# Patient Record
Sex: Female | Born: 1975 | Hispanic: No | Marital: Married | Smoking: Former smoker
Health system: Southern US, Community
[De-identification: ages and names within clinical notes are randomized; demographics above are authoritative.]

## PROBLEM LIST (undated history)

## (undated) DIAGNOSIS — D649 Anemia, unspecified: Secondary | ICD-10-CM

## (undated) DIAGNOSIS — J45909 Unspecified asthma, uncomplicated: Secondary | ICD-10-CM

## (undated) HISTORY — DX: Unspecified asthma, uncomplicated: J45.909

## (undated) HISTORY — DX: Anemia, unspecified: D64.9

---

## 1999-02-19 ENCOUNTER — Other Ambulatory Visit: Admission: RE | Admit: 1999-02-19 | Discharge: 1999-02-19 | Payer: Self-pay | Admitting: *Deleted

## 2002-01-25 ENCOUNTER — Emergency Department (HOSPITAL_COMMUNITY): Admission: EM | Admit: 2002-01-25 | Discharge: 2002-01-25 | Payer: Self-pay | Admitting: Emergency Medicine

## 2002-01-25 ENCOUNTER — Encounter: Payer: Self-pay | Admitting: Emergency Medicine

## 2004-01-26 ENCOUNTER — Emergency Department (HOSPITAL_COMMUNITY): Admission: EM | Admit: 2004-01-26 | Discharge: 2004-01-27 | Payer: Self-pay | Admitting: Emergency Medicine

## 2006-09-26 ENCOUNTER — Other Ambulatory Visit: Admission: RE | Admit: 2006-09-26 | Discharge: 2006-09-26 | Payer: Self-pay | Admitting: Obstetrics and Gynecology

## 2007-11-20 ENCOUNTER — Other Ambulatory Visit: Admission: RE | Admit: 2007-11-20 | Discharge: 2007-11-20 | Payer: Self-pay | Admitting: Obstetrics and Gynecology

## 2009-10-19 ENCOUNTER — Encounter: Admission: RE | Admit: 2009-10-19 | Discharge: 2009-10-19 | Payer: Self-pay | Admitting: Obstetrics & Gynecology

## 2011-11-22 ENCOUNTER — Encounter (HOSPITAL_COMMUNITY): Payer: Self-pay | Admitting: Emergency Medicine

## 2011-11-22 ENCOUNTER — Emergency Department (HOSPITAL_COMMUNITY)
Admission: EM | Admit: 2011-11-22 | Discharge: 2011-11-22 | Disposition: A | Discharge status: Home / Self Care | Payer: BC Managed Care – PPO | Attending: Emergency Medicine | Admitting: Emergency Medicine

## 2011-11-22 DIAGNOSIS — R1110 Vomiting, unspecified: Secondary | ICD-10-CM | POA: Insufficient documentation

## 2011-11-22 DIAGNOSIS — J029 Acute pharyngitis, unspecified: Secondary | ICD-10-CM | POA: Insufficient documentation

## 2011-11-22 LAB — RAPID STREP SCREEN (MED CTR MEBANE ONLY): Streptococcus, Group A Screen (Direct): NEGATIVE

## 2011-11-22 MED ORDER — SODIUM CHLORIDE 0.9 % IV BOLUS (SEPSIS)
1000.0000 mL | Freq: Once | INTRAVENOUS | Status: AC
  Administered 2011-11-22: 1000 mL via INTRAVENOUS

## 2011-11-22 MED ORDER — KETOROLAC TROMETHAMINE 30 MG/ML IJ SOLN
30.0000 mg | Freq: Once | INTRAMUSCULAR | Status: AC
  Administered 2011-11-22: 30 mg via INTRAVENOUS
  Filled 2011-11-22: qty 1

## 2011-11-22 MED ORDER — IBUPROFEN 800 MG PO TABS: 800.0000 mg | ORAL_TABLET | Freq: Three times a day (TID) | ORAL | Status: AC | PRN

## 2011-11-22 MED ORDER — ONDANSETRON 8 MG PO TBDP: 8.0000 mg | ORAL_TABLET | Freq: Three times a day (TID) | ORAL | Status: AC | PRN

## 2011-11-22 MED ORDER — ONDANSETRON HCL 4 MG/2ML IJ SOLN
4.0000 mg | Freq: Once | INTRAMUSCULAR | Status: AC
  Administered 2011-11-22: 4 mg via INTRAVENOUS
  Filled 2011-11-22: qty 2

## 2011-11-22 NOTE — Progress Notes (Signed)
[facility] ED CM noted pt without pcp noted CM spoke with pt who confirms she does not have pcp but has BCBS coverage CM instructed pt how to obtain in network providers for [employer] versus offering a list of local providers that have not been determined in network with BCBS to assure her co pay for BCBS will be received by correct provider Instructed in use of toll free number and/or web site listed on back of her insurance card Discussed the importance of pcp for follow care from [facility] ED  and routine care

## 2011-11-22 NOTE — ED Provider Notes (Signed)
History     CSN: [id number]  Arrival date & time 11/22/11  1020   First MD Initiated Contact with Patient 11/22/11 1021      Chief Complaint  Patient presents with  . Fever    (Consider location/radiation/quality/duration/timing/severity/associated sxs/prior treatment) HPI Comments: Patient presents with complaint of fever, sore throat, nausea and vomiting. Patient started feeling poorly last evening. She took ibuprofen yesterday without relief. She has not measured her temperature at home. She has had a mild cough but no shortness of breath. No ear pain. She has had a runny nose. No abdominal pain. No urinary symptoms. Swallowing makes her sore throat worse. Nothing makes symptoms better. Onset was acute. Course is gradually worsening. No sick contacts. No significant past medical history.  Patient is a 36 y.o. female presenting with fever. The history is provided by the patient.  Fever Primary symptoms of the febrile illness include fever, fatigue, headaches, cough, nausea and vomiting. Primary symptoms do not include wheezing, shortness of breath, abdominal pain, diarrhea, dysuria, myalgias, arthralgias or rash. The current episode started yesterday. This is a new problem. The problem has not changed since onset. The headache is not associated with neck stiffness.    History reviewed. No pertinent past medical history.  No past surgical history on file.  No family history on file.  History  Substance Use Topics  . Smoking status: Not on file  . Smokeless tobacco: Not on file  . Alcohol Use: Not on file    OB History    Grav Para Term Preterm Abortions TAB SAB Ect Mult Living                  Review of Systems  Constitutional: Positive for fever and fatigue.  HENT: Positive for congestion, sore throat and rhinorrhea. Negative for ear pain, trouble swallowing, neck pain and neck stiffness.   Eyes: Negative for redness.  Respiratory: Positive for cough. Negative for  shortness of breath and wheezing.   Cardiovascular: Negative for chest pain.  Gastrointestinal: Positive for nausea and vomiting. Negative for abdominal pain and diarrhea.  Genitourinary: Negative for dysuria.  Musculoskeletal: Negative for myalgias and arthralgias.  Skin: Negative for rash.  Neurological: Positive for headaches.    Allergies  Review of patient's allergies indicates not on file.  Home Medications  No current outpatient prescriptions on file.  BP 114/68  Pulse 112  Temp 99.9 F (37.7 C) (Oral)  Resp 18  Ht 5' (1.524 m)  Wt 125 lb (56.7 kg)  BMI 24.41 kg/m2  SpO2 100%  Physical Exam  Nursing note and vitals reviewed. Constitutional: She appears well-developed and well-nourished.  HENT:  Head: Normocephalic and atraumatic.  Right Ear: Tympanic membrane, external ear and ear canal normal.  Left Ear: Tympanic membrane, external ear and ear canal normal.  Nose: Rhinorrhea present. No mucosal edema.  Mouth/Throat: Uvula is midline. Mucous membranes are not dry. Posterior oropharyngeal erythema present. No oropharyngeal exudate, posterior oropharyngeal edema or tonsillar abscesses.  Eyes: Conjunctivae are normal. Right eye exhibits no discharge. Left eye exhibits no discharge.  Neck: Normal range of motion. Neck supple.  Cardiovascular: Normal rate, regular rhythm and normal heart sounds.   Pulmonary/Chest: Effort normal and breath sounds normal.  Abdominal: Soft. There is no tenderness.  Neurological: She is alert.  Skin: Skin is warm and dry.  Psychiatric: She has a normal mood and affect.    ED Course  Procedures (including critical care time)   Labs Reviewed  RAPID STREP  SCREEN  LAB REPORT - SCANNED   No results found.   1. Pharyngitis   2. Vomiting     10:40 AM Patient seen and examined. Work-up initiated. Medications ordered. Fluids given tachycardia.   Vital signs reviewed and are as follows: Filed Vitals:   11/22/11 1038  BP: 114/68    Pulse: 112  Temp: 99.9 F (37.7 C)  Resp: 18   No contraindication to toradol.   Patient feels much improved after treatment. HR improved. Throat exam unchanged. Informed of neg strep. She is drinking in room. She requests discharge to home.   Urged to rest, use tylenol/ibuprofen for symptomatic control.   Urged to return with worsening pain, high fever, persistent vomiting, other concerns. She verbalizes understanding and agrees with plan.    MDM  Pharyngitis, strep neg. Tachycardia resolved after fluids, toradol. Pain improved. Afebrile. She is appropriate for discharge home.         [place], [place] 11/23/11 [phone]

## 2011-11-23 NOTE — ED Provider Notes (Signed)
Medical screening examination/treatment/procedure(s) were conducted as a shared visit with non-physician practitioner(s) and myself.  I personally evaluated the patient during the encounter.  Pharyngeal area shows minimal tonsillar exudate.  Strep negative. Feeling better after IV fluids and Toradol.  No meningeal signs  Donnetta Hutching, MD 11/23/11 [phone]

## 2011-11-28 LAB — HM PAP SMEAR: HM Pap smear: NEGATIVE

## 2014-03-25 ENCOUNTER — Ambulatory Visit (INDEPENDENT_AMBULATORY_CARE_PROVIDER_SITE_OTHER): Payer: BC Managed Care – PPO | Admitting: Physician Assistant

## 2014-03-25 ENCOUNTER — Ambulatory Visit (INDEPENDENT_AMBULATORY_CARE_PROVIDER_SITE_OTHER): Payer: BC Managed Care – PPO

## 2014-03-25 VITALS — BP 152/98 | HR 77 | Temp 98.0°F | Resp 16 | Ht 59.0 in | Wt 137.4 lb

## 2014-03-25 DIAGNOSIS — S39012A Strain of muscle, fascia and tendon of lower back, initial encounter: Secondary | ICD-10-CM

## 2014-03-25 DIAGNOSIS — M545 Low back pain, unspecified: Secondary | ICD-10-CM

## 2014-03-25 DIAGNOSIS — S3992XA Unspecified injury of lower back, initial encounter: Secondary | ICD-10-CM

## 2014-03-25 MED ORDER — METAXALONE 800 MG PO TABS: 400.0000 mg | ORAL_TABLET | Freq: Three times a day (TID) | ORAL | Status: DC

## 2014-03-25 MED ORDER — MELOXICAM 7.5 MG PO TABS: ORAL_TABLET | ORAL | Status: DC
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
## 2014-03-25 NOTE — Progress Notes (Signed)
I have discussed this case with Mr. Tamara Marquez, [place] and agree. Radiographs reviewed by and discussed with Dr. Laney Pastor.

## 2014-03-25 NOTE — Patient Instructions (Signed)
Take two 500 mg Tylenol every eight hours as needed for added pain relief.  Do not take Ibuprofen or Aleve while taking Meloxicam.  Try to avoid take Skelaxin during the working hours.    Back Pain, Adult Low back pain is very common. About 1 in 5 people have back pain.The cause of low back pain is rarely dangerous. The pain often gets better over time.About half of people with a sudden onset of back pain feel better in just 2 weeks. About 8 in 10 people feel better by 6 weeks.  CAUSES Some common causes of back pain include:  Strain of the muscles or ligaments supporting the spine.  Wear and tear (degeneration) of the spinal discs.  Arthritis.  Direct injury to the back. DIAGNOSIS Most of the time, the direct cause of low back pain is not known.However, back pain can be treated effectively even when the exact cause of the pain is unknown.Answering your caregiver's questions about your overall health and symptoms is one of the most accurate ways to make sure the cause of your pain is not dangerous. If your caregiver needs more information, he or she may order lab work or imaging tests (X-rays or MRIs).However, even if imaging tests show changes in your back, this usually does not require surgery. HOME CARE INSTRUCTIONS For many people, back pain returns.Since low back pain is rarely dangerous, it is often a condition that people can learn to [facility] their own.   Remain active. It is stressful on the back to sit or stand in one place. Do not sit, drive, or stand in one place for more than 30 minutes at a time. Take short walks on level surfaces as soon as pain allows.Try to increase the length of time you walk each day.  Do not stay in bed.Resting more than 1 or 2 days can delay your recovery.  Do not avoid exercise or work.Your body is made to move.It is not dangerous to be active, even though your back may hurt.Your back will likely heal faster if you return to being active  before your pain is gone.  Pay attention to your body when you bend and lift. Many people have less discomfortwhen lifting if they bend their knees, keep the load close to their bodies,and avoid twisting. Often, the most comfortable positions are those that put less stress on your recovering back.  Find a comfortable position to sleep. Use a firm mattress and lie on your side with your knees slightly bent. If you lie on your back, put a pillow under your knees.  Only take over-the-counter or prescription medicines as directed by your caregiver. Over-the-counter medicines to reduce pain and inflammation are often the most helpful.Your caregiver may prescribe muscle relaxant drugs.These medicines help dull your pain so you can more quickly return to your normal activities and healthy exercise.  Put ice on the injured area.  Put ice in a plastic bag.  Place a towel between your skin and the bag.  Leave the ice on for 15-20 minutes, 03-04 times a day for the first 2 to 3 days. After that, ice and heat may be alternated to reduce pain and spasms.  Ask your caregiver about trying back exercises and gentle massage. This may be of some benefit.  Avoid feeling anxious or stressed.Stress increases muscle tension and can worsen back pain.It is important to recognize when you are anxious or stressed and learn ways to manage it.Exercise is a great option. [place]  IF:  You have pain that is not relieved with rest or medicine.  You have pain that does not improve in 1 week.  You have new symptoms.  You are generally not feeling well. SEEK IMMEDIATE MEDICAL CARE IF:   You have pain that radiates from your back into your legs.  You develop new bowel or bladder control problems.  You have unusual weakness or numbness in your arms or legs.  You develop nausea or vomiting.  You develop abdominal pain.  You feel faint. Document Released: 04/15/2005 Document Revised: 10/15/2011  Document Reviewed: 08/17/2013 [employer] Patient Information 2015 [place], [place]. This information is not intended to replace advice given to you by your health care provider. Make sure you discuss any questions you have with your health care provider.

## 2014-11-23 ENCOUNTER — Ambulatory Visit (INDEPENDENT_AMBULATORY_CARE_PROVIDER_SITE_OTHER): Payer: BC Managed Care – PPO | Admitting: Nurse Practitioner

## 2014-11-23 ENCOUNTER — Encounter: Payer: Self-pay | Admitting: *Deleted

## 2014-11-23 ENCOUNTER — Encounter: Payer: Self-pay | Admitting: Nurse Practitioner

## 2014-11-23 VITALS — BP 110/72 | HR 80 | Ht 58.5 in | Wt 137.8 lb

## 2014-11-23 DIAGNOSIS — Z01419 Encounter for gynecological examination (general) (routine) without abnormal findings: Secondary | ICD-10-CM

## 2014-11-23 DIAGNOSIS — Z0000 Encounter for general adult medical examination without abnormal findings: Secondary | ICD-10-CM | POA: Diagnosis not present

## 2014-11-23 LAB — POCT URINALYSIS DIPSTICK
LEUKOCYTES UA: NEGATIVE
UROBILINOGEN UA: NEGATIVE
pH, UA: 5

## 2014-11-23 MED ORDER — DESOGESTREL-ETHINYL ESTRADIOL 0.15-0.02/0.01 MG (21/5) PO TABS: 1.0000 | ORAL_TABLET | Freq: Every day | ORAL | Status: DC

## 2014-11-23 NOTE — Progress Notes (Signed)
39 y.o. G0 Single  Asian Fe here for annual exam.  menses is slightly irregular at 28 - 36 days.  Menses last 4-5 days,  Flow is moderate to light.  She is not dating and not SA.  She would like to go back on OCP for cycle regulation.  Off OCP since 01/2011- [place].  Patient's last menstrual period was 11/16/2014.          Sexually active: No.  The current method of family planning is none.    Exercising: No.  The patient does not participate in regular exercise at present. Smoker:  no  Health Maintenance: Pap:  11/28/2011 wnl neg HR HPV TDaP:  11/21/2008 Labs: HGB: 11.1  ; Urine: Negative    reports that she quit smoking about 2 years ago. She has never used smokeless tobacco. She reports that she drinks alcohol. She reports that she does not use illicit drugs.  History reviewed. No pertinent past medical history.  History reviewed. No pertinent past surgical history.  Current Outpatient Prescriptions  Medication Sig Dispense Refill  . zolpidem (AMBIEN) 5 MG tablet Take 5 mg by mouth at bedtime as needed. For sleep.    Marland Kitchen desogestrel-ethinyl estradiol (KARIVA,AZURETTE,MIRCETTE) 0.15-0.02/0.01 MG (21/5) tablet Take 1 tablet by mouth daily. 3 Package 4   No current facility-administered medications for this visit.    Family History  Problem Relation Age of Onset  . Diabetes Mother   . Hyperlipidemia Mother   . Hypertension Mother     ROS:  Pertinent items are noted in HPI.  Otherwise, a comprehensive ROS was negative.  Exam:   BP 110/72 mmHg  Pulse 80  Ht 4' 10.5" (1.486 m)  Wt 137 lb 12.8 oz (62.506 kg)  BMI 28.31 kg/m2  LMP 11/16/2014 Height: 4' 10.5" (148.6 cm) Ht Readings from Last 3 Encounters:  11/23/14 4' 10.5" (1.486 m)  11/28/11 4' 9.75" (1.467 m)  03/25/14 4\' 11"  (1.499 m)    General appearance: alert, cooperative and appears stated age Head: Normocephalic, without obvious abnormality, atraumatic Neck: no adenopathy, supple, symmetrical, trachea midline and  thyroid normal to inspection and palpation Lungs: clear to auscultation bilaterally Breasts: normal appearance, no masses or tenderness Heart: regular rate and rhythm Abdomen: soft, non-tender; no masses,  no organomegaly Extremities: extremities normal, atraumatic, no cyanosis or edema Skin: Skin color, texture, turgor normal. No rashes or lesions Lymph nodes: Cervical, supraclavicular, and axillary nodes normal. No abnormal inguinal nodes palpated Neurologic: Grossly normal   Pelvic: External genitalia:  no lesions              Urethra:  normal appearing urethra with no masses, tenderness or lesions              Bartholin's and Skene's: normal                 Vagina: normal appearing vagina with normal color and discharge, no lesions              Cervix: anteverted              Pap taken: Yes.   Bimanual Exam:  Uterus:  normal size, contour, position, consistency, mobility, non-tender              Adnexa: no mass, fullness, tenderness               Rectovaginal: Confirms               Anus:  normal sphincter tone, no  lesions  Chaperone present: Yes  A:  Well Woman with normal exam  irregular menses. off OCP  Would like to restart OCP for cycle regulation  P:   Reviewed health and wellness pertinent to exam  Pap smear as above  Will start back on OCP - Mircette as directed - will heave her return in 3 months for a recheck.  She is given side effects, risk and precautions.  Reviewed start date and BUM if needed.  Counseled on breast self exam, use and side effects of OCP's, adequate intake of calcium and vitamin D, diet and exercise return annually or prn  An After Visit Summary was printed and given to the patient.

## 2014-11-23 NOTE — Patient Instructions (Signed)

## 2014-11-26 LAB — IPS PAP TEST WITH HPV

## 2014-11-26 NOTE — Progress Notes (Signed)
Encounter reviewed by Dr. Brook Amundson C. Silva.  

## 2014-11-28 ENCOUNTER — Other Ambulatory Visit: Payer: Self-pay | Admitting: Nurse Practitioner

## 2014-11-28 LAB — HEMOGLOBIN, FINGERSTICK: HEMOGLOBIN, FINGERSTICK: 11.1 g/dL — AB (ref 12.0–16.0)

## 2014-11-28 MED ORDER — METRONIDAZOLE 0.75 % VA GEL: 1.0000 | Freq: Every day | VAGINAL | Status: DC

## 2015-02-22 ENCOUNTER — Ambulatory Visit: Payer: BC Managed Care – PPO | Admitting: Nurse Practitioner

## 2015-08-23 ENCOUNTER — Telehealth: Payer: Self-pay | Admitting: Nurse Practitioner

## 2015-08-23 NOTE — Telephone Encounter (Signed)
Spoke with patient. Patient states that for around 2 months she has been experiencing light spotting when she exercises. Is experiencing intermittent "uterine" pain with spotting. Reports she is experiencing light spotting and pain today. Pain is a 4-5/10. Reports she has not taken any medication for the pain. Advised she may take 600-800 mg of Ibuprofen.Motrn every 6-8 hours for pain relief. Declines any heavy bleeding, fever, or chills. Advised patient she will need to be seen in the office for further evaluation. Offered appointment for today but patient declines. Appointment scheduled for 08/25/2015 at 12:45 pm with Kem Boroughs, FNP. She is agreeable to date and time. Aware if her symptoms worsen or she develops new symptoms will need to be seen in the office earlier for evaluation. If symptoms change or worsen over night she will need to be seen with a local urgent care or ER. She is agreeable.  Routing to provider for final review. Patient agreeable to disposition. Will close encounter.

## 2015-08-23 NOTE — Telephone Encounter (Signed)
Patient is having abnormal bleeding and wants to talk with PG

## 2015-08-25 ENCOUNTER — Encounter: Payer: Self-pay | Admitting: Nurse Practitioner

## 2015-08-25 ENCOUNTER — Ambulatory Visit (INDEPENDENT_AMBULATORY_CARE_PROVIDER_SITE_OTHER): Payer: BC Managed Care – PPO | Admitting: Nurse Practitioner

## 2015-08-25 VITALS — BP 120/76 | HR 64 | Temp 98.2°F | Ht 58.5 in | Wt 129.0 lb

## 2015-08-25 DIAGNOSIS — N939 Abnormal uterine and vaginal bleeding, unspecified: Secondary | ICD-10-CM | POA: Diagnosis not present

## 2015-08-25 DIAGNOSIS — Z113 Encounter for screening for infections with a predominantly sexual mode of transmission: Secondary | ICD-10-CM

## 2015-08-25 DIAGNOSIS — N926 Irregular menstruation, unspecified: Secondary | ICD-10-CM

## 2015-08-25 LAB — POCT URINE PREGNANCY: Preg Test, Ur: NEGATIVE

## 2015-08-25 NOTE — Progress Notes (Signed)
Scheduled patient while in office for PUS on 08/31/2015 at 1:00 pm with 1:30 pm consult with Dr.Miller. She is agreeable to date and time. Order placed for precert.

## 2015-08-25 NOTE — Patient Instructions (Signed)
Stay home over the weekend. Go to [facility] if pain increases.

## 2015-08-25 NOTE — Progress Notes (Signed)
Subjective:     Patient ID: Tamara Marquez, female   DOB: 03/15/1976, 40 y.o.   MRN: [id number]  HPI  This 40 yo Asian female G0 presents with irregular menses.  She is now dating and has been SA since 04/26/15.  She now feels that after SA, after exercise program or lifting that she will get brown/ pink spotting.  She is also for past few months not using any birth control and is hopeful to obtain a pregnancy.  Her Last normal menses was at the end of March ~ 28 th.   Since then on off has been having  BTB more often.  Then 4/19 & 4/20 had dark 'old blood' for 2 days.  Then became regular flow on 4/22;  but now still spotting / bleeding.  Some lower abdominal cramps that are bilateral.   No known exposure to any STD's.  She denies urinary symptoms, fever/chills/ N/V. She denies pain with SA. Some vaginal discharge that is normal for her.  In general 'does not feel well' but thinks this is related to working 2 jobs.    Trying for a pregnancy for past several months.  Off OCP since 01/2011.  Review of Systems  Constitutional: Positive for fatigue. Negative for fever, chills, appetite change and unexpected weight change.  Respiratory: Negative.   Cardiovascular: Negative.   Gastrointestinal: Negative.  Negative for nausea, vomiting, diarrhea, constipation, blood in stool, anal bleeding and rectal pain.  Genitourinary: Positive for menstrual problem and pelvic pain. Negative for dysuria, urgency, frequency, hematuria, decreased urine volume, vaginal bleeding, vaginal discharge, genital sores, vaginal pain and dyspareunia.  Musculoskeletal: Negative.   Skin: Negative.   Neurological: Positive for weakness. Negative for dizziness, tremors, syncope, facial asymmetry, light-headedness and headaches.  Psychiatric/Behavioral: Negative.  Negative for confusion, sleep disturbance, dysphoric mood and decreased concentration. The patient is not nervous/anxious.        Objective:   Physical Exam   Constitutional: She is oriented to person, place, and time. She appears well-developed and well-nourished. No distress.  Cardiovascular: Normal rate.   Pulmonary/Chest: Effort normal.  Abdominal: Soft. She exhibits no distension and no mass. There is tenderness. There is no rebound and no guarding.  Bilateral lower quadrants.  Genitourinary:  Cervical OS is normal without evidence of a polyp.  Specimen for Affirm and GC & chlamydia is done.  No cervical motion tenderness but she is uncomfortable in left adnexa without evidence of a mass.  UPT is negative.   Musculoskeletal: Normal range of motion.  Neurological: She is alert and oriented to person, place, and time.  Skin: Skin is warm and dry.  Psychiatric: She has a normal mood and affect. Her behavior is normal. Judgment and thought content normal.       Assessment:     AUB R/O STD Lower pelvic pain ? etiology    Plan:     Will follow with test results She will remain home from work over the weekend and rest May take Advil prn, hydrate well. If her pain increases at all to go to [facility] Will schedule a PUS for next week to evaluate AUB - hopefully all test results will be back by then.

## 2015-08-26 LAB — STD PANEL
HEP B S AG: NEGATIVE
HIV: NONREACTIVE

## 2015-08-26 LAB — WET PREP BY MOLECULAR PROBE
CANDIDA SPECIES: NEGATIVE
Gardnerella vaginalis: NEGATIVE
TRICHOMONAS VAG: NEGATIVE

## 2015-08-26 NOTE — Progress Notes (Signed)
Encounter reviewed by Dr. Brook Amundson C. Silva.  

## 2015-08-28 ENCOUNTER — Telehealth: Payer: Self-pay | Admitting: Nurse Practitioner

## 2015-08-28 NOTE — Telephone Encounter (Signed)
Patient was in on Friday to see Chong Sicilian and said she was supposed to get a note to be out of work today, Tuesday and returning to work on Wednesday.

## 2015-08-28 NOTE — Telephone Encounter (Signed)
Yes my error in not getting her a note.

## 2015-08-28 NOTE — Telephone Encounter (Signed)
Tamara Boroughs, FNP, is it okay to write a letter for the patient to be out of work today and tomorrow? 08/28/2015-08/29/2015.

## 2015-08-29 LAB — IPS N GONORRHOEA AND CHLAMYDIA BY PCR

## 2015-08-29 NOTE — Telephone Encounter (Signed)
Letter to Kem Boroughs, FNP for review and signature.

## 2015-08-29 NOTE — Telephone Encounter (Signed)
Left message to call [place] at [phone].  Letter has been written and is ready for pick up or faxed to work if needed.

## 2015-08-30 NOTE — Telephone Encounter (Signed)
Pt notified note is ready.  Pt will pick up 08/31/15 at ultrasound appointment.    Pt states she is still having bright red bleeding.  Bleeding is light.  She is still have lower pelvic pain that is not relieved by Aleve. Advised pt I would talk with Edman Circle, FNP for any recommendations before appointment on 5/4 and return call if needed.  Pt agreeable.  Please advise.

## 2015-08-31 ENCOUNTER — Other Ambulatory Visit: Payer: BC Managed Care – PPO

## 2015-08-31 ENCOUNTER — Other Ambulatory Visit: Payer: BC Managed Care – PPO | Admitting: Obstetrics & Gynecology

## 2015-08-31 NOTE — Telephone Encounter (Signed)
Phone call to patient regarding left lower abdominal pain.  Pain goes from from 5 to 8.  Pain comes and goes.  Upon waking the pain is not bad, but then it increases.  Pain is preventing patient from working. Patient is a Medical illustrator, and her work is very physical. Taking Aleve, and this does not relieve the pain.  No fevers.  Some nausea but no vomiting.  Feels decreased energy. Some fatigue.  LMP on 08/16/15. Having spotting which is bright red.   Had negative UPT and STD check at office visit.   Patient given torsion precautions.  To [facility] if develops fever, increased pain, vomiting, or other concern.  Ultrasound can be performed during the weekend if needed.  OK to keep appointment for next week.

## 2015-09-07 ENCOUNTER — Encounter: Payer: Self-pay | Admitting: Obstetrics & Gynecology

## 2015-09-07 ENCOUNTER — Ambulatory Visit (INDEPENDENT_AMBULATORY_CARE_PROVIDER_SITE_OTHER): Payer: BC Managed Care – PPO | Admitting: Obstetrics & Gynecology

## 2015-09-07 ENCOUNTER — Ambulatory Visit (INDEPENDENT_AMBULATORY_CARE_PROVIDER_SITE_OTHER): Payer: BC Managed Care – PPO

## 2015-09-07 VITALS — BP 116/72 | HR 102 | Resp 12 | Wt 129.0 lb

## 2015-09-07 DIAGNOSIS — Z0184 Encounter for antibody response examination: Secondary | ICD-10-CM | POA: Diagnosis not present

## 2015-09-07 DIAGNOSIS — D251 Intramural leiomyoma of uterus: Secondary | ICD-10-CM | POA: Diagnosis not present

## 2015-09-07 DIAGNOSIS — N926 Irregular menstruation, unspecified: Secondary | ICD-10-CM | POA: Diagnosis not present

## 2015-09-07 DIAGNOSIS — N83202 Unspecified ovarian cyst, left side: Secondary | ICD-10-CM

## 2015-09-07 DIAGNOSIS — N938 Other specified abnormal uterine and vaginal bleeding: Secondary | ICD-10-CM | POA: Diagnosis not present

## 2015-09-07 NOTE — Progress Notes (Signed)
40 y.o. G0P0 Single Asian female here for pelvic ultrasound due to DUB with irregular bleeding from 4/22 -5/8.  This was a long cycle for pt who also had some BTB the prior month.  Cycle has now ended and she denies any additional spotting.  Reports the last two to three weeks, she's also had RLQ discomfort after prolonged standing or after exercise or after walking longer distances.  Denies changes in bowel or bladder habits.  Patient's last menstrual period was 08/16/2015.  Contraception: none.  Trying for pregnancy.  Findings:  UTERUS: 8.6 x 5.7 x 4.3cm with 1.0cm, 1.7cm, and 0.6cm intramural fibroids EMS: 8.51mm, symmetric ADNEXA: Left ovary: 2.8 x 2.7 x 1.9cm with 2.1cm cyst       Right ovary:  2.4 x 1.9 x 1.3cm CUL DE SAC: mild, clear free fluid  Discussion:  Findings reviewed with pt.  Cyst has simple findings.  Follow up not recommended unless pain does not resolve over next six weeks.  Also, fibroids discussed.  I do not feel this will interfere with her desires for pregnancy.  Pregnancy and age discussed.  Referral offered.  Pt desires to try longer for pregnancy.  PNV encouraged.  Day 3 FSH discussed.  She would like to do this and will call with next cycle.  As this irregular bleeding was just this month and followed by a longer cycle, I would recommend just waiting and watching.  If [place] is elevated and or irregular bleeding continues, additional evaluation and/or referral would be prudent.  Pt voices understanding and agreement with plan.  Assessment:  DUB 2.1cm left ovarian cyst Desires to be pregnant Several small intramural fibroids   Plan:  Day 3 FSH and rubella titer planned Pt knows to call if irregular bleeding continues or if desires REI referral  ~15 minutes spent with patient >50% of time was in face to face discussion of above.

## 2015-11-27 ENCOUNTER — Ambulatory Visit: Payer: BC Managed Care – PPO | Admitting: Nurse Practitioner

## 2015-12-12 ENCOUNTER — Ambulatory Visit (INDEPENDENT_AMBULATORY_CARE_PROVIDER_SITE_OTHER): Payer: BC Managed Care – PPO | Admitting: Nurse Practitioner

## 2015-12-12 ENCOUNTER — Encounter: Payer: Self-pay | Admitting: Nurse Practitioner

## 2015-12-12 VITALS — BP 110/74 | HR 60 | Ht 59.0 in | Wt 139.0 lb

## 2015-12-12 DIAGNOSIS — N926 Irregular menstruation, unspecified: Secondary | ICD-10-CM | POA: Diagnosis not present

## 2015-12-12 DIAGNOSIS — Z01419 Encounter for gynecological examination (general) (routine) without abnormal findings: Secondary | ICD-10-CM

## 2015-12-12 DIAGNOSIS — Z0000 Encounter for general adult medical examination without abnormal findings: Secondary | ICD-10-CM

## 2015-12-12 LAB — POCT URINALYSIS DIPSTICK
BILIRUBIN UA: NEGATIVE
Blood, UA: NEGATIVE
GLUCOSE UA: NEGATIVE
Ketones, UA: NEGATIVE
LEUKOCYTES UA: NEGATIVE
NITRITE UA: NEGATIVE
Protein, UA: NEGATIVE
UROBILINOGEN UA: NEGATIVE
pH, UA: 6

## 2015-12-12 LAB — POCT URINE PREGNANCY: PREG TEST UR: NEGATIVE

## 2015-12-12 NOTE — Progress Notes (Signed)
Patient ID: Tamara Marquez, female   DOB: 01/20/1976, 40 y.o.   MRN: [id number]  39 y.o. G0P0000 Single  Asian Fe here for annual exam.  Previous menses 7/5 -7-9.  Then another cycle 7/16 - 7/24.  Both were light in flow and not sure which was her normal.  Since then no menses.  No breast tenderness  Still trying a for a pregnancy and on prenatal MVI.  Same partner for 1 yr.  He is now with a Licensed conveyancer job in [place].  They plan to be flying back and forth on the weekends.  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  She has very limited time to get her 3 day [place].  Wants to know if there is another test she can get done today to check her fertility.  She is very anxious to get pregnant.  Patient's last menstrual period was 11/12/2015 (exact date).          Sexually active: Yes.    The current method of family planning is none. Trying for pregnancy. Exercising: No.  The patient does not participate in regular exercise at present. Smoker:  no  Health Maintenance: Pap: 11/23/14, Negative with neg HR HPV TDaP:  11/21/08 HIV: 08/25/15 Labs: 07/2015 in EPIC  UA: negative  UPT: negative   reports that she quit smoking about 4 years ago. She has never used smokeless tobacco. She reports that she drinks alcohol. She reports that she does not use drugs.  History reviewed. No pertinent past medical history.  History reviewed. No pertinent surgical history.  Current Outpatient Prescriptions  Medication Sig Dispense Refill  . amoxicillin (AMOXIL) 500 MG capsule Take 1 capsule by mouth 3 (three) times daily.    Marland Kitchen zolpidem (AMBIEN) 10 MG tablet Take 1 tablet by mouth at bedtime as needed.     No current facility-administered medications for this visit.     Family History  Problem Relation Age of Onset  . Diabetes Mother   . Hyperlipidemia Mother   . Hypertension Mother     ROS:  Pertinent items are noted in HPI.  Otherwise, a comprehensive ROS was  negative.  Exam:   BP 110/74 (BP Location: Right Arm, Patient Position: Sitting, Cuff Size: Normal)   Pulse 60   Ht 4\' 11"  (1.499 m)   Wt 139 lb (63 kg)   LMP 11/12/2015 (Exact Date) Comment: x 7 days, previous bleeding 7/5-7/9  BMI 28.07 kg/m  Height: 4\' 11"  (149.9 cm) Ht Readings from Last 3 Encounters:  12/12/15 4\' 11"  (1.499 m)  08/25/15 4' 10.5" (1.486 m)  11/23/14 4' 10.5" (1.486 m)    General appearance: alert, cooperative and appears stated age Head: Normocephalic, without obvious abnormality, atraumatic Neck: no adenopathy, supple, symmetrical, trachea midline and thyroid normal to inspection and palpation Lungs: clear to auscultation bilaterally Breasts: normal appearance, no masses or tenderness Heart: regular rate and rhythm Abdomen: soft, non-tender; no masses,  no organomegaly Extremities: extremities normal, atraumatic, no cyanosis or edema Skin: Skin color, texture, turgor normal. No rashes or lesions Lymph nodes: Cervical, supraclavicular, and axillary nodes normal. No abnormal inguinal nodes palpated Neurologic: Grossly normal   Pelvic: External genitalia:  no lesions              Urethra:  normal appearing urethra with no masses, tenderness or lesions              Bartholin's and Skene's: normal  Vagina: normal appearing vagina with normal color and discharge, no lesions              Cervix: anteverted              Pap taken: No. Bimanual Exam:  Uterus:  normal size, contour, position, consistency, mobility, non-tender              Adnexa: no mass, fullness, tenderness               Rectovaginal: Confirms               Anus:  normal sphincter tone, no lesions  Chaperone present: yes  A:  Well Woman with normal exam   History of irregular menses. off OCP             Trying for a pregnancy   P:   Reviewed health and wellness pertinent to exam  Pap smear as above  Mammogram at age 45  Consult with Dr. Sabra Heck and will get [facility] today and  follow  Counseled on breast self exam, adequate intake of calcium and vitamin D, diet and exercise return annually or prn  An After Visit Summary was printed and given to the patient.

## 2015-12-12 NOTE — Patient Instructions (Signed)

## 2015-12-13 NOTE — Progress Notes (Signed)
Reviewed personally.  M. Suzanne Nyquan Selbe, MD.  

## 2015-12-15 LAB — ANTI MULLERIAN HORMONE: AMH ASSESSR: 0.91 ng/mL

## 2015-12-20 ENCOUNTER — Telehealth: Payer: Self-pay | Admitting: Nurse Practitioner

## 2015-12-20 NOTE — Telephone Encounter (Signed)
Pt was called to give her test results of AMH and notes per Dr. Sabra Heck.  The cell phone listed to leave a detailed message was on her boyfriends cell.  I left him a message to have her call us back.

## 2015-12-21 NOTE — Telephone Encounter (Signed)
Patient returned call. Wagoner for [employer] to leave a detailed message on her cell phone voicemail because she will be at work Friday.

## 2015-12-22 NOTE — Telephone Encounter (Signed)
Patient is left a detailed message on her cell phone by request.  She is given results of AMH at 0.91.  Her test results were reviewed with Dr. Sabra Heck.  Due to her age, we recommend an infertility specialist to try and help her get pregnant sooner rather than later.  She works in [place] so it may be helpful if she saws someone there - but she is to contact us for a referral either here or [place].

## 2016-04-08 ENCOUNTER — Ambulatory Visit (INDEPENDENT_AMBULATORY_CARE_PROVIDER_SITE_OTHER): Payer: BC Managed Care – PPO | Admitting: Nurse Practitioner

## 2016-04-08 ENCOUNTER — Other Ambulatory Visit: Payer: Self-pay | Admitting: *Deleted

## 2016-04-08 ENCOUNTER — Encounter: Payer: Self-pay | Admitting: Nurse Practitioner

## 2016-04-08 VITALS — BP 110/76 | HR 72 | Ht 59.0 in | Wt 145.0 lb

## 2016-04-08 DIAGNOSIS — N912 Amenorrhea, unspecified: Secondary | ICD-10-CM

## 2016-04-08 LAB — POCT URINE PREGNANCY: Preg Test, Ur: POSITIVE — AB

## 2016-04-08 NOTE — Patient Instructions (Signed)
First Trimester of Pregnancy The first trimester of pregnancy is from week 1 until the end of week 12 (months 1 through 3). A week after a sperm fertilizes an egg, the egg will implant on the wall of the uterus. This embryo will begin to develop into a baby. Genes from you and your partner are forming the baby. The female genes determine whether the baby is a boy or a girl. At 6-8 weeks, the eyes and face are formed, and the heartbeat can be seen on ultrasound. At the end of 12 weeks, all the baby's organs are formed.  Now that you are pregnant, you will want to do everything you can to have a healthy baby. Two of the most important things are to get good prenatal care and to follow your health care provider's instructions. Prenatal care is all the medical care you receive before the baby's birth. This care will help prevent, find, and treat any problems during the pregnancy and childbirth. BODY CHANGES Your body goes through many changes during pregnancy. The changes vary from woman to woman.   You may gain or lose a couple of pounds at first.  You may feel sick to your stomach (nauseous) and throw up (vomit). If the vomiting is uncontrollable, call your health care provider.  You may tire easily.  You may develop headaches that can be relieved by medicines approved by your health care provider.  You may urinate more often. Painful urination may mean you have a bladder infection.  You may develop heartburn as a result of your pregnancy.  You may develop constipation because certain hormones are causing the muscles that push waste through your intestines to slow down.  You may develop hemorrhoids or swollen, bulging veins (varicose veins).  Your breasts may begin to grow larger and become tender. Your nipples may stick out more, and the tissue that surrounds them (areola) may become darker.  Your gums may bleed and may be sensitive to brushing and flossing.  Dark spots or blotches (chloasma,  mask of pregnancy) may develop on your face. This will likely fade after the baby is born.  Your menstrual periods will stop.  You may have a loss of appetite.  You may develop cravings for certain kinds of food.  You may have changes in your emotions from day to day, such as being excited to be pregnant or being concerned that something may go wrong with the pregnancy and baby.  You may have more vivid and strange dreams.  You may have changes in your hair. These can include thickening of your hair, rapid growth, and changes in texture. Some women also have hair loss during or after pregnancy, or hair that feels dry or thin. Your hair will most likely return to normal after your baby is born. WHAT TO EXPECT AT YOUR PRENATAL VISITS During a routine prenatal visit:  You will be weighed to make sure you and the baby are growing normally.  Your blood pressure will be taken.  Your abdomen will be measured to track your baby's growth.  The fetal heartbeat will be listened to starting around week 10 or 12 of your pregnancy.  Test results from any previous visits will be discussed. Your health care provider may ask you:  How you are feeling.  If you are feeling the baby move.  If you have had any abnormal symptoms, such as leaking fluid, bleeding, severe headaches, or abdominal cramping.  If you are using any tobacco products,   including cigarettes, chewing tobacco, and electronic cigarettes.  If you have any questions. Other tests that may be performed during your first trimester include:  Blood tests to find your blood type and to check for the presence of any previous infections. They will also be used to check for low iron levels (anemia) and Rh antibodies. Later in the pregnancy, blood tests for diabetes will be done along with other tests if problems develop.  Urine tests to check for infections, diabetes, or protein in the urine.  An ultrasound to confirm the proper growth  and development of the baby.  An amniocentesis to check for possible genetic problems.  Fetal screens for spina bifida and Down syndrome.  You may need other tests to make sure you and the baby are doing well.  HIV (human immunodeficiency virus) testing. Routine prenatal testing includes screening for HIV, unless you choose not to have this test. HOME CARE INSTRUCTIONS  Medicines   Follow your health care provider's instructions regarding medicine use. Specific medicines may be either safe or unsafe to take during pregnancy.  Take your prenatal vitamins as directed.  If you develop constipation, try taking a stool softener if your health care provider approves. Diet   Eat regular, well-balanced meals. Choose a variety of foods, such as meat or vegetable-based protein, fish, milk and low-fat dairy products, vegetables, fruits, and whole grain breads and cereals. Your health care provider will help you determine the amount of weight gain that is right for you.  Avoid raw meat and uncooked cheese. These carry germs that can cause birth defects in the baby.  Eating four or five small meals rather than three large meals a day may help relieve nausea and vomiting. If you start to feel nauseous, eating a few soda crackers can be helpful. Drinking liquids between meals instead of during meals also seems to help nausea and vomiting.  If you develop constipation, eat more high-fiber foods, such as fresh vegetables or fruit and whole grains. Drink enough fluids to keep your urine clear or pale yellow. Activity and Exercise   Exercise only as directed by your health care provider. Exercising will help you:  Control your weight.  Stay in shape.  Be prepared for labor and delivery.  Experiencing pain or cramping in the lower abdomen or low back is a good sign that you should stop exercising. Check with your health care provider before continuing normal exercises.  Try to avoid standing for  long periods of time. Move your legs often if you must stand in one place for a long time.  Avoid heavy lifting.  Wear low-heeled shoes, and practice good posture.  You may continue to have sex unless your health care provider directs you otherwise. Relief of Pain or Discomfort   Wear a good support bra for breast tenderness.   Take warm sitz baths to soothe any pain or discomfort caused by hemorrhoids. Use hemorrhoid cream if your health care provider approves.   Rest with your legs elevated if you have leg cramps or low back pain.  If you develop varicose veins in your legs, wear support hose. Elevate your feet for 15 minutes, 3-4 times a day. Limit salt in your diet. Prenatal Care   Schedule your prenatal visits by the twelfth week of pregnancy. They are usually scheduled monthly at first, then more often in the last 2 months before delivery.  Write down your questions. Take them to your prenatal visits.  Keep all your prenatal  legs, wear support hose. Elevate your feet for 15 minutes, 3-4 times a day. Limit salt in your diet.  Prenatal Care  · Schedule your prenatal visits by the twelfth week of pregnancy. They are usually scheduled monthly at first, then more often in the last 2 months before delivery.  · Write down your questions. Take them to your prenatal visits.  · Keep all your prenatal visits as directed by your health care provider.  Safety  · Wear your seat belt at all times when driving.  · Make a list of emergency phone numbers, including numbers for family, friends, the hospital, and police and fire departments.  General Tips  · Ask your health care provider for a referral to a local prenatal education class. Begin classes no later than at the beginning of month 6 of your pregnancy.  · Ask for help if you have counseling or nutritional needs during pregnancy. Your health care provider can offer advice or refer you to specialists for help with various needs.  · Do not use hot tubs, steam rooms, or saunas.  · Do not douche or use tampons or scented sanitary pads.  · Do not cross your legs for long periods of time.  · Avoid cat litter boxes and soil used by cats. These carry germs that can cause birth defects in the baby and possibly loss of the fetus by miscarriage or stillbirth.   · Avoid all smoking, herbs, alcohol, and medicines not prescribed by your health care provider. Chemicals in these affect the formation and growth of the baby.  · Do not use any tobacco products, including cigarettes, chewing tobacco, and electronic cigarettes. If you need help quitting, ask your health care provider. You may receive counseling support and other resources to help you quit.  · Schedule a dentist appointment. At home, brush your teeth with a soft toothbrush and be gentle when you floss.  SEEK MEDICAL CARE IF:   · You have dizziness.  · You have mild pelvic cramps, pelvic pressure, or nagging pain in the abdominal area.  · You have persistent nausea, vomiting, or diarrhea.  · You have a bad smelling vaginal discharge.  · You have pain with urination.  · You notice increased swelling in your face, hands, legs, or ankles.  SEEK IMMEDIATE MEDICAL CARE IF:   · You have a fever.  · You are leaking fluid from your vagina.  · You have spotting or bleeding from your vagina.  · You have severe abdominal cramping or pain.  · You have rapid weight gain or loss.  · You vomit blood or material that looks like coffee grounds.  · You are exposed to German measles and have never had them.  · You are exposed to fifth disease or chickenpox.  · You develop a severe headache.  · You have shortness of breath.  · You have any kind of trauma, such as from a fall or a car accident.     This information is not intended to replace advice given to you by your health care provider. Make sure you discuss any questions you have with your health care provider.     Document Released: 04/09/2001 Document Revised: 05/06/2014 Document Reviewed: 02/23/2013  Elsevier Interactive Patient Education ©2017 Elsevier Inc.

## 2016-04-08 NOTE — Progress Notes (Signed)
40 y.o. Single Asian female G1P0000 here for conformation of pregnancy.  She had her last LMP about mid October ??  She took a UPT on Friday and it was positive.  She is having a lot of fatigue and bladder pressure.  May had brown spotting over a week ago with wiping  - none since.  No pain.   O: Healthy WD,WN female Affect: normal  A: Amenorrhea with + UPT    P:  Discussed findings of UPT and will schedule PUS for confirmation of viable pregnancy  She is to continue with prenatal MVI  She may decide to quit her second job.  Call if any bleeding or pain   Labs :  UPT  Instructions given regarding: pregnancy first trimester  RV

## 2016-04-09 ENCOUNTER — Encounter: Payer: Self-pay | Admitting: Obstetrics and Gynecology

## 2016-04-09 ENCOUNTER — Ambulatory Visit (INDEPENDENT_AMBULATORY_CARE_PROVIDER_SITE_OTHER): Payer: BC Managed Care – PPO

## 2016-04-09 ENCOUNTER — Ambulatory Visit (INDEPENDENT_AMBULATORY_CARE_PROVIDER_SITE_OTHER): Payer: BC Managed Care – PPO | Admitting: Obstetrics and Gynecology

## 2016-04-09 VITALS — BP 118/70 | HR 76 | Resp 15 | Wt 145.0 lb

## 2016-04-09 DIAGNOSIS — N912 Amenorrhea, unspecified: Secondary | ICD-10-CM | POA: Diagnosis not present

## 2016-04-09 DIAGNOSIS — D259 Leiomyoma of uterus, unspecified: Secondary | ICD-10-CM

## 2016-04-09 NOTE — Progress Notes (Signed)
Encounter reviewed Jill Jertson, MD   

## 2016-04-09 NOTE — Progress Notes (Signed)
GYNECOLOGY  VISIT   HPI: 40 y.o.   Single  Asian  female   G1P0000 with Patient's last menstrual period was 02/11/2016 (within weeks).   here for OB U/S. She has a h/o irregular menses. + UPT on Friday 04/05/16, negative UPT on Sunday 03/31/16. She had some spotting last week prior to her +UPT. Small amount. She feels pre-menstrual cramping, fatigue. Not sleeping well for the last week.  She has been trying to get pregnant seriously since June, 2017. No protection for a year.  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 She is wondering about work.  GYNECOLOGIC HISTORY: Patient's last menstrual period was 02/11/2016 (within weeks). Contraception:none Menopausal hormone therapy: none         OB History    Gravida Para Term Preterm AB Living   1 0 0 0 0 0   SAB TAB Ectopic Multiple Live Births   0 0 0 0 0         Patient Active Problem List   Diagnosis Date Noted  . Fibroids, intramural 09/07/2015    No past medical history on file.  No past surgical history on file.  Current Outpatient Prescriptions  Medication Sig Dispense Refill  . Prenatal Vit-Fe Fumarate-FA (PRENATAL MULTIVITAMIN) TABS tablet Take 1 tablet by mouth daily at 12 noon.    Marland Kitchen zolpidem (AMBIEN) 10 MG tablet Take 1 tablet by mouth at bedtime as needed.     No current facility-administered medications for this visit.      ALLERGIES: Patient has no known allergies.  Family History  Problem Relation Age of Onset  . Diabetes Mother   . Hyperlipidemia Mother   . Hypertension Mother     Social History   Social History  . Marital status: Single    Spouse name: N/A  . Number of children: N/A  . Years of education: N/A   Occupational History  . Not on file.   Social History Main Topics  . Smoking status: Former Smoker    Quit date: 11/28/2011  . Smokeless tobacco: Never Used  . Alcohol use 0.0 oz/week  . Drug use: No  . Sexual activity: Yes    Partners: Male    Birth control/  protection: None   Other Topics Concern  . Not on file   Social History Narrative  . No narrative on file    Review of Systems  Constitutional: Negative.   HENT: Negative.   Eyes: Negative.   Respiratory: Negative.   Cardiovascular: Negative.   Gastrointestinal: Negative.   Genitourinary:       Amenorrhea   Musculoskeletal: Negative.   Skin: Negative.   Neurological: Negative.   Endo/Heme/Allergies: Negative.   Psychiatric/Behavioral: Negative.     PHYSICAL EXAMINATION:    BP 118/70 (BP Location: Right Arm, Patient Position: Sitting, Cuff Size: Normal)   Pulse 76   Resp 15   Wt 145 lb (65.8 kg)   LMP 02/11/2016 (Within Weeks)   BMI 29.29 kg/m     General appearance: alert, cooperative and appears stated age  Reviewed ultrasound pictures with the patient  ASSESSMENT +UPT Spotting the week prior to +UPT Mild cramping U/S c/w 5 week pregnancy, h/o irregular cycles Several small fibroids, possible cervical polyp    PLAN BhcG today and repeat in 2 days Call with heavy bleeding or abdominal pain Discussed the increased risk of SAB at 40    An After Visit Summary was printed and given to the patient.  15 minutes face to face time of which over 50% was spent in counseling.

## 2016-04-10 ENCOUNTER — Other Ambulatory Visit: Payer: Self-pay | Admitting: Obstetrics and Gynecology

## 2016-04-10 ENCOUNTER — Telehealth: Payer: Self-pay

## 2016-04-10 DIAGNOSIS — N912 Amenorrhea, unspecified: Secondary | ICD-10-CM

## 2016-04-10 LAB — ABO AND RH: RH TYPE: POSITIVE

## 2016-04-10 LAB — HCG, QUANTITATIVE, PREGNANCY: hCG, Beta Chain, Quant, S: 2494.7 m[IU]/mL — ABNORMAL HIGH

## 2016-04-10 NOTE — Telephone Encounter (Signed)
Spoke with patient. Advised of message and results as seen below from [place]. Patient verbalizes understanding. Lab order for HCG quant changed to STAT. Patient has lab appointment scheduled for 04/11/2016 at 9 am and is agreeable to date and time. Patient will return call with any concerns or questions.  Routing to provider for final review. Patient agreeable to disposition. Will close encounter.

## 2016-04-10 NOTE — Telephone Encounter (Signed)
-----   Message from Salvadore Dom, MD sent at 04/10/2016  8:57 AM EST ----- Margaretha Sheffield: Please let the patient know that her BHCG does go along with the ultrasound findings. Her blood type is B+. This is reassuring. The next step is to see if her BhcG is increasing. She should call with any concerns. Will likely set her up for another ultrasound next week.  We need to keep track of her BhcG. The one for tomorrow was ordered routine, please change it to stat. We need to make sure we make a plan after the results return.  CC: Hermenia Fritcher, please confirm we have made a plan by Friday morning or call me.

## 2016-04-11 ENCOUNTER — Other Ambulatory Visit (INDEPENDENT_AMBULATORY_CARE_PROVIDER_SITE_OTHER): Payer: BC Managed Care – PPO

## 2016-04-11 ENCOUNTER — Encounter: Payer: Self-pay | Admitting: Obstetrics and Gynecology

## 2016-04-11 ENCOUNTER — Telehealth: Payer: Self-pay

## 2016-04-11 DIAGNOSIS — R8279 Other abnormal findings on microbiological examination of urine: Secondary | ICD-10-CM

## 2016-04-11 DIAGNOSIS — N912 Amenorrhea, unspecified: Secondary | ICD-10-CM

## 2016-04-11 DIAGNOSIS — Z3201 Encounter for pregnancy test, result positive: Secondary | ICD-10-CM

## 2016-04-11 LAB — HCG, QUANTITATIVE, PREGNANCY: HCG, BETA CHAIN, QUANT, S: 4810.3 m[IU]/mL — AB

## 2016-04-11 NOTE — Telephone Encounter (Signed)
-----   Message from Salvadore Dom, MD sent at 04/11/2016  1:09 PM EST ----- Please inform the patient that her rise in the BhcG is very good and set her up for another ultrasound next Tuesday.

## 2016-04-11 NOTE — Telephone Encounter (Signed)
Spoke with patient. Advised of message and results as seen below from [place]. Patient verbalizes understanding. Patient is supposed to be going out of town and will not return until the beginning of next year. Would like to see if her trip can be pushed back until after 12/19. Will return call for scheduling.

## 2016-04-12 NOTE — Telephone Encounter (Signed)
Spoke with patient. Patient states that she has reviewed her schedule and made adjustments. Is able to bee seen on 04/18/2016 only for viability ultrasound due to travel plans. Viability ultrasound scheduled for 04/18/2016 at 8:30 am with 9 am consult with Dr.Jertson. Patient is agreeable and verbalizes understanding. Order placed for precert.  Routing to provider for final review. Patient agreeable to disposition. Will close encounter.

## 2016-04-18 ENCOUNTER — Ambulatory Visit (INDEPENDENT_AMBULATORY_CARE_PROVIDER_SITE_OTHER): Payer: BC Managed Care – PPO | Admitting: Obstetrics and Gynecology

## 2016-04-18 ENCOUNTER — Ambulatory Visit (INDEPENDENT_AMBULATORY_CARE_PROVIDER_SITE_OTHER): Payer: BC Managed Care – PPO

## 2016-04-18 ENCOUNTER — Encounter: Payer: Self-pay | Admitting: Obstetrics and Gynecology

## 2016-04-18 VITALS — BP 104/70 | HR 64 | Resp 14 | Wt 145.0 lb

## 2016-04-18 DIAGNOSIS — N912 Amenorrhea, unspecified: Secondary | ICD-10-CM | POA: Diagnosis not present

## 2016-04-18 DIAGNOSIS — R21 Rash and other nonspecific skin eruption: Secondary | ICD-10-CM | POA: Diagnosis not present

## 2016-04-18 DIAGNOSIS — Z3490 Encounter for supervision of normal pregnancy, unspecified, unspecified trimester: Secondary | ICD-10-CM

## 2016-04-18 DIAGNOSIS — Z3201 Encounter for pregnancy test, result positive: Secondary | ICD-10-CM

## 2016-04-18 NOTE — Progress Notes (Signed)
      GYNECOLOGY  VISIT   HPI: 40 y.o.   Single  Asian  female   G1P0000 with Patient's last menstrual period was 02/11/2016 (within weeks).   here for OB Viability U/S. She is also c/o a rash on both legs. The rash just started and is itchy. No rash anywhere else. No change in soaps or detergents. Otherwise she feels fine. No nausea, no bleeding.   GYNECOLOGIC HISTORY: Patient's last menstrual period was 02/11/2016 (within weeks). Contraception:Pregnant  Menopausal hormone therapy: none         OB History    Gravida Para Term Preterm AB Living   1 0 0 0 0 0   SAB TAB Ectopic Multiple Live Births   0 0 0 0 0         Patient Active Problem List   Diagnosis Date Noted  . Fibroids, intramural 09/07/2015    No past medical history on file.  No past surgical history on file.  Current Outpatient Prescriptions  Medication Sig Dispense Refill  . Prenatal Vit-Fe Fumarate-FA (PRENATAL MULTIVITAMIN) TABS tablet Take 1 tablet by mouth daily at 12 noon.    Marland Kitchen zolpidem (AMBIEN) 10 MG tablet Take 1 tablet by mouth at bedtime as needed.     No current facility-administered medications for this visit.      ALLERGIES: Patient has no known allergies.  Family History  Problem Relation Age of Onset  . Diabetes Mother   . Hyperlipidemia Mother   . Hypertension Mother     Social History   Social History  . Marital status: Single    Spouse name: N/A  . Number of children: N/A  . Years of education: N/A   Occupational History  . Not on file.   Social History Main Topics  . Smoking status: Former Smoker    Quit date: 11/28/2011  . Smokeless tobacco: Never Used  . Alcohol use 0.0 oz/week  . Drug use: No  . Sexual activity: Yes    Partners: Male    Birth control/ protection: None   Other Topics Concern  . Not on file   Social History Narrative  . No narrative on file    Review of Systems  Constitutional: Negative.   HENT: Negative.   Eyes: Negative.   Respiratory:  Negative.   Cardiovascular: Negative.   Gastrointestinal: Negative.   Genitourinary: Negative.   Musculoskeletal: Negative.   Skin: Positive for rash.  Neurological: Negative.   Endo/Heme/Allergies: Negative.   Psychiatric/Behavioral: Negative.     PHYSICAL EXAMINATION:    LMP 02/11/2016 (Within Weeks)     General appearance: alert, cooperative and appears stated age Skin: erythematous, macular rash on her bilateral inner thighs, spreads slightly to her anterior thighs.   Ultrasound images reviewed  ASSESSMENT Early IUP Rash on her legs, some of the rash appears to be where her thighs are rubbing. She denies changes in soaps or detergents    PLAN She is on PNV Will establish care with OB If her rash isn't improving, she should f/u with dermatology or primary care She can try Calamine lotion or Vaseline for the skin irritation   An After Visit Summary was printed and given to the patient.

## 2016-06-27 DIAGNOSIS — F4323 Adjustment disorder with mixed anxiety and depressed mood: Secondary | ICD-10-CM | POA: Insufficient documentation

## 2016-07-05 ENCOUNTER — Encounter (HOSPITAL_COMMUNITY): Payer: Self-pay | Admitting: Obstetrics and Gynecology

## 2016-07-09 ENCOUNTER — Other Ambulatory Visit (HOSPITAL_COMMUNITY): Payer: Self-pay | Admitting: Obstetrics and Gynecology

## 2016-07-09 DIAGNOSIS — Z3689 Encounter for other specified antenatal screening: Secondary | ICD-10-CM

## 2016-07-09 DIAGNOSIS — Z3A18 18 weeks gestation of pregnancy: Secondary | ICD-10-CM

## 2016-07-09 DIAGNOSIS — O09522 Supervision of elderly multigravida, second trimester: Secondary | ICD-10-CM

## 2016-07-12 ENCOUNTER — Ambulatory Visit (HOSPITAL_COMMUNITY): Payer: BC Managed Care – PPO

## 2016-07-12 ENCOUNTER — Other Ambulatory Visit: Payer: Self-pay

## 2016-07-12 ENCOUNTER — Encounter (HOSPITAL_COMMUNITY): Payer: Self-pay

## 2016-07-12 ENCOUNTER — Ambulatory Visit (HOSPITAL_COMMUNITY)
Admission: RE | Admit: 2016-07-12 | Discharge: 2016-07-12 | Disposition: A | Discharge status: Home / Self Care | Payer: BC Managed Care – PPO | Source: Ambulatory Visit | Attending: Obstetrics and Gynecology | Admitting: Obstetrics and Gynecology

## 2016-11-05 ENCOUNTER — Telehealth: Payer: Self-pay | Admitting: Obstetrics & Gynecology

## 2016-11-05 NOTE — Telephone Encounter (Signed)
Left message on voicemail to call and reschedule cancelled appointment. Mail letter °

## 2016-12-13 ENCOUNTER — Ambulatory Visit: Payer: BC Managed Care – PPO | Admitting: Nurse Practitioner

## 2017-01-09 DIAGNOSIS — O24439 Gestational diabetes mellitus in the puerperium, unspecified control: Secondary | ICD-10-CM | POA: Insufficient documentation

## 2017-03-08 ENCOUNTER — Encounter (HOSPITAL_COMMUNITY): Payer: Self-pay

## 2019-03-31 ENCOUNTER — Encounter: Payer: Self-pay | Admitting: Certified Nurse Midwife

## 2019-04-20 ENCOUNTER — Encounter: Payer: Self-pay | Admitting: Certified Nurse Midwife

## 2019-04-20 ENCOUNTER — Ambulatory Visit: Payer: 59 | Admitting: Certified Nurse Midwife

## 2019-04-20 ENCOUNTER — Other Ambulatory Visit (HOSPITAL_COMMUNITY)
Admission: RE | Admit: 2019-04-20 | Discharge: 2019-04-20 | Disposition: A | Discharge status: Home / Self Care | Payer: 59 | Source: Ambulatory Visit | Attending: Certified Nurse Midwife | Admitting: Certified Nurse Midwife

## 2019-04-20 ENCOUNTER — Other Ambulatory Visit: Payer: Self-pay

## 2019-04-20 VITALS — BP 108/70 | HR 68 | Temp 98.2°F | Ht 58.75 in | Wt 162.0 lb

## 2019-04-20 DIAGNOSIS — Z124 Encounter for screening for malignant neoplasm of cervix: Secondary | ICD-10-CM | POA: Diagnosis not present

## 2019-04-20 DIAGNOSIS — Z01411 Encounter for gynecological examination (general) (routine) with abnormal findings: Secondary | ICD-10-CM

## 2019-04-20 DIAGNOSIS — N644 Mastodynia: Secondary | ICD-10-CM

## 2019-04-20 DIAGNOSIS — N6320 Unspecified lump in the left breast, unspecified quadrant: Secondary | ICD-10-CM | POA: Diagnosis not present

## 2019-04-20 NOTE — Patient Instructions (Signed)
EXERCISE AND DIET:  We recommended that you start or continue a regular exercise program for good health. Regular exercise means any activity that makes your heart beat faster and makes you sweat.  We recommend exercising at least 30 minutes per day at least 3 days a week, preferably 4 or 5.  We also recommend a diet low in fat and sugar.  Inactivity, poor dietary choices and obesity can cause diabetes, heart attack, stroke, and kidney damage, among others.    ALCOHOL AND SMOKING:  Women should limit their alcohol intake to no more than 7 drinks/beers/glasses of wine (combined, not each!) per week. Moderation of alcohol intake to this level decreases your risk of breast cancer and liver damage. And of course, no recreational drugs are part of a healthy lifestyle.  And absolutely no smoking or even second hand smoke. Most people know smoking can cause heart and lung diseases, but did you know it also contributes to weakening of your bones? Aging of your skin?  Yellowing of your teeth and nails?  CALCIUM AND VITAMIN D:  Adequate intake of calcium and Vitamin D are recommended.  The recommendations for exact amounts of these supplements seem to change often, but generally speaking 600 mg of calcium (either carbonate or citrate) and 800 units of Vitamin D per day seems prudent. Certain women may benefit from higher intake of Vitamin D.  If you are among these women, your doctor will have told you during your visit.    PAP SMEARS:  Pap smears, to check for cervical cancer or precancers,  have traditionally been done yearly, although recent scientific advances have shown that most women can have pap smears less often.  However, every woman still should have a physical exam from her gynecologist every year. It will include a breast check, inspection of the vulva and vagina to check for abnormal growths or skin changes, a visual exam of the cervix, and then an exam to evaluate the size and shape of the uterus and  ovaries.  And after 43 years of age, a rectal exam is indicated to check for rectal cancers. We will also provide age appropriate advice regarding health maintenance, like when you should have certain vaccines, screening for sexually transmitted diseases, bone density testing, colonoscopy, mammograms, etc.   MAMMOGRAMS:  All women over 40 years old should have a yearly mammogram. Many facilities now offer a "3D" mammogram, which may cost around $50 extra out of pocket. If possible,  we recommend you accept the option to have the 3D mammogram performed.  It both reduces the number of women who will be called back for extra views which then turn out to be normal, and it is better than the routine mammogram at detecting truly abnormal areas.    COLONOSCOPY:  Colonoscopy to screen for colon cancer is recommended for all women at age 50.  We know, you hate the idea of the prep.  We agree, BUT, having colon cancer and not knowing it is worse!!  Colon cancer so often starts as a polyp that can be seen and removed at colonscopy, which can quite literally save your life!  And if your first colonoscopy is normal and you have no family history of colon cancer, most women don't have to have it again for 10 years.  Once every ten years, you can do something that may end up saving your life, right?  We will be happy to help you get it scheduled when you are ready.    Be sure to check your insurance coverage so you understand how much it will cost.  It may be covered as a preventative service at no cost, but you should check your particular policy.      Breast Tenderness Breast tenderness is a common problem for women of all ages. Breast tenderness may cause mild discomfort to severe pain. The pain usually comes and goes in association with your menstrual cycle, but it can be constant. Breast tenderness has many possible causes, including hormone changes and some medicines. Your health care provider may order tests, such as  a mammogram or an ultrasound, to check for any unusual findings. Having breast tenderness usually does not mean that you have breast cancer. Follow these instructions at home: Sometimes, reassurance that you do not have breast cancer is all that is needed. In general, follow these home care instructions: Managing pain and discomfort   If directed, apply ice to the area: ? Put ice in a plastic bag. ? Place a towel between your skin and the bag. ? Leave the ice on for 20 minutes, 2-3 times a day.  Make sure you are wearing a supportive bra, especially during exercise. You may also want to wear a supportive bra while sleeping if your breasts are very tender. Medicines  Take over-the-counter and prescription medicines only as told by your health care provider. If the cause of your pain is infection, you may be prescribed an antibiotic medicine.  If you were prescribed an antibiotic, take it as told by your health care provider. Do not stop taking the antibiotic even if you start to feel better. General instructions   Your health care provider may recommend that you reduce the amount of fat in your diet. You can do this by: ? Limiting fried foods. ? Cooking foods using methods, such as baking, boiling, grilling, and broiling.  Decrease the amount of caffeine in your diet. You can do this by drinking more water and choosing caffeine-free options.  Keep a log of the days and times when your breasts are most tender.  Ask your health care provider how to do breast exams at home. This will help you notice if you have an unusual growth or lump. Contact a health care provider if:  Any part of your breast is hard, red, and hot to the touch. This may be a sign of infection.  You are not breastfeeding and you have fluid, especially blood or pus, coming out of your nipples.  You have a fever.  You have a new or painful lump in your breast that remains after your menstrual period ends.  Your  pain does not improve or it gets worse.  Your pain is interfering with your daily activities. This information is not intended to replace advice given to you by your health care provider. Make sure you discuss any questions you have with your health care provider. Document Released: 03/28/2008 Document Revised: 03/28/2017 Document Reviewed: 01/12/2016 Elsevier Patient Education  2020 [employer].

## 2019-04-20 NOTE — Progress Notes (Signed)
43 y.o. G1P0001 Married  Asian Fe here to establish gyn care and  for annual exam. Contraception previous infertility. Concerned about her left breast with feeling a little fuller. Would like to have checked. Has stopped breastfeeding in 04/29/2018. Has noted no redness, but slight tenderness. Denies fever or trauma to breast. Dr.Katura is PCP for insomnia and prn visits.  Uses Ambien for insomnia with PCP management.All stable at this point.   Patient's last menstrual period was 04/13/2019 (exact date).          Sexually active: Yes.    The current method of family planning is none.    Exercising: No.  exercise Smoker:  no  Review of Systems  Constitutional: Negative.   HENT: Negative.   Eyes: Negative.   Respiratory: Negative.   Cardiovascular: Negative.   Gastrointestinal: Negative.   Genitourinary: Negative.   Musculoskeletal: Negative.   Skin:       Left breast tenderness  Neurological: Negative.   Endo/Heme/Allergies: Negative.   Psychiatric/Behavioral: Negative.     Health Maintenance: Pap:  11-23-14 neg HPV HR neg History of Abnormal Pap: no MMG:  none Self Breast exams: yes Colonoscopy: none BMD:   none TDaP:  2018 Shingles: not done Pneumonia: not done Hep C and HIV: HIV neg 2017 Labs:  If needed   reports that she quit smoking about 7 years ago. She has never used smokeless tobacco. She reports current alcohol use. She reports that she does not use drugs.  Past Medical History:  Diagnosis Date  . Anemia   . Asthma     Past Surgical History:  Procedure Laterality Date  . CESAREAN SECTION      Current Outpatient Medications  Medication Sig Dispense Refill  . zolpidem (AMBIEN) 10 MG tablet Take 1 tablet by mouth at bedtime as needed.     No current facility-administered medications for this visit.    Family History  Problem Relation Age of Onset  . Diabetes Mother   . Hyperlipidemia Mother   . Hypertension Father     ROS:  Pertinent items are noted  in HPI.  Otherwise, a comprehensive ROS was negative.  Exam:   BP 108/70   Pulse 68   Temp 98.2 F (36.8 C) (Skin)   Ht 4' 10.75" (1.492 m)   Wt 162 lb (73.5 kg)   LMP 04/13/2019 (Exact Date)   BMI 33.00 kg/m  Height: 4' 10.75" (149.2 cm) Ht Readings from Last 3 Encounters:  04/20/19 4' 10.75" (1.492 m)  04/08/16 4\' 11"  (1.499 m)  12/12/15 4\' 11"  (1.499 m)    General appearance: alert, cooperative and appears stated age Head: Normocephalic, without obvious abnormality, atraumatic Neck: no adenopathy, supple, symmetrical, trachea midline and thyroid normal to inspection and palpation Lungs: clear to auscultation bilaterally Breasts: normal appearance, no masses or tenderness, No nipple retraction or dimpling, No nipple discharge or bleeding, No axillary or supraclavicular adenopathy left breast not tender to touch, no nipple discharge, but slight fullness at 7-8 o'clock,no distinct mass palpated, no redness. Heart: regular rate and rhythm Abdomen: soft, non-tender; no masses,  no organomegaly Extremities: extremities normal, atraumatic, no cyanosis or edema Skin: Skin color, texture, turgor normal. No rashes or lesions Lymph nodes: Cervical, supraclavicular, and axillary nodes normal. No abnormal inguinal nodes palpated Neurologic: Grossly normal   Pelvic: External genitalia:  no lesions, normal female              Urethra:  normal appearing urethra with no masses, tenderness or  lesions              Bartholin's and Skene's: normal                 Vagina: normal appearing vagina with normal color and discharge, no lesions              Cervix: no cervical motion tenderness, no lesions and nulliparous appearance              Pap taken: Yes.   Bimanual Exam:  Uterus:  normal size, contour, position, consistency, mobility, non-tender              Adnexa: normal adnexa and no mass, fullness, tenderness               Rectovaginal: Confirms               Anus:  normal sphincter tone,  no lesions  Chaperone present: yes  A:  Well Woman with normal exam  Contraception none history of infertility  History of fibroids no uterine enlargement noted  Left breast tenderness per patient, but not on exam, slight fullness no distinct mass. Mammogram due.  Insomnia with PCP management of medication    P:   Reviewed health and wellness pertinent to exam  Discussed left breast finding and feel she should have diagnostic mammogram with [place] if needed. Patient agreeable. She will be called with information regarding mammogram.  Continue follow up with PCP as indicated.  Pap smear: yes   counseled on breast self exam, mammography screening, feminine hygiene, adequate intake of calcium and vitamin D, diet and exercise  return annually or prn  An After Visit Summary was printed and given to the patient.

## 2019-04-21 ENCOUNTER — Telehealth: Payer: Self-pay | Admitting: *Deleted

## 2019-04-21 DIAGNOSIS — N644 Mastodynia: Secondary | ICD-10-CM

## 2019-04-21 DIAGNOSIS — N6320 Unspecified lump in the left breast, unspecified quadrant: Secondary | ICD-10-CM

## 2019-04-21 LAB — CYTOLOGY - PAP
Comment: NEGATIVE
Diagnosis: NEGATIVE
High risk HPV: NEGATIVE

## 2019-04-21 NOTE — Telephone Encounter (Signed)
Spoke with Anderson Malta at [facility], scheduled for bilateral Dx MMG and left breast US, if needed, on 05/05/19 at 11:20am, arrive at 11am.    Patient placed in MMG hold.    Spoke with patient. Advised of appt details. Patient is agreeable to date and time. Contact information provided to patient for TBC.   Routing to provider for final review. Patient is agreeable to disposition. Will close encounter.

## 2019-04-21 NOTE — Telephone Encounter (Signed)
-----   Message from Regina Eck, CNM sent at 04/21/2019 12:17 PM EST ----- See details in chart note, left breast tenderness per patient, not noted on exam, but fullness, no distinct mass Diagnostic mammogram with [place]

## 2019-04-24 NOTE — Progress Notes (Signed)
Pap smear negative 02

## 2019-05-05 ENCOUNTER — Ambulatory Visit: Admission: RE | Admit: 2019-05-05 | Payer: BC Managed Care – PPO | Source: Ambulatory Visit

## 2019-05-05 ENCOUNTER — Ambulatory Visit
Admission: RE | Admit: 2019-05-05 | Discharge: 2019-05-05 | Disposition: A | Discharge status: Home / Self Care | Payer: 59 | Source: Ambulatory Visit | Attending: Certified Nurse Midwife | Admitting: Certified Nurse Midwife

## 2019-05-05 ENCOUNTER — Other Ambulatory Visit: Payer: Self-pay

## 2019-05-05 DIAGNOSIS — N644 Mastodynia: Secondary | ICD-10-CM

## 2019-05-05 DIAGNOSIS — N6320 Unspecified lump in the left breast, unspecified quadrant: Secondary | ICD-10-CM

## 2019-05-10 ENCOUNTER — Ambulatory Visit: Payer: Self-pay | Admitting: Certified Nurse Midwife

## 2019-05-10 ENCOUNTER — Telehealth: Payer: Self-pay | Admitting: Certified Nurse Midwife

## 2019-05-10 NOTE — Telephone Encounter (Signed)
Left message to call Sadeel Fiddler, RN at [facility] [phone].   

## 2019-05-10 NOTE — Telephone Encounter (Signed)
Patient cancelled breast recheck because she is sick. Will call back to reschedule.

## 2019-05-18 NOTE — Telephone Encounter (Signed)
Left message to call Blakelynn Scheeler, RN at [facility] [phone].   

## 2019-06-08 NOTE — Telephone Encounter (Signed)
Left message to call Colletta Maryland, RN

## 2019-06-09 NOTE — Telephone Encounter (Signed)
05/05/19 Bilateral Dx MMG for left breast pain and fullness for approximately 1 yr since she stopped breast feeding.   Patient was advised on 05/07/19 of MMG results and scheduled for an OV for continued left breast pain.   Patient has not returned call x3 to reschedule recheck. MyChart not active.   Dr. Sabra Heck -please advise on follow up.   Cc: Melvia Heaps, CNM

## 2019-06-10 NOTE — Telephone Encounter (Signed)
Patient removed from MMG hold.   Encounter closed.  

## 2019-06-10 NOTE — Telephone Encounter (Signed)
Pt clearly aware of recommendations and multiple calls have been made to pt. Ok to remove from [facility] recall/hold.  Ok to stop calling.    CC: Melvia Heaps, CNM, just as Juluis Rainier as well.

## 2019-07-21 ENCOUNTER — Encounter: Payer: Self-pay | Admitting: Certified Nurse Midwife

## 2020-05-01 ENCOUNTER — Ambulatory Visit: Payer: 59 | Admitting: Certified Nurse Midwife

## 2021-08-29 IMAGING — MG DIGITAL DIAGNOSTIC BILAT W/ TOMO W/ CAD
5 of 8 series · 5 of 24 positions shown · non-contrast
Comparison: Previous exam(s).
COMPARISON: None.

*** End of Addendum ***
COMPARISON: Previous exam(s).

Addendum:
CLINICAL DATA: 43-year-old patient presenting for baseline exam and
reports asymmetric diffuse left breast pain and fullness for
approximately 1 year since she stopped breast feeding.

EXAM:
DIGITAL DIAGNOSTIC BILATERAL MAMMOGRAM WITH CAD AND TOMO

[R MLO synth-2D]
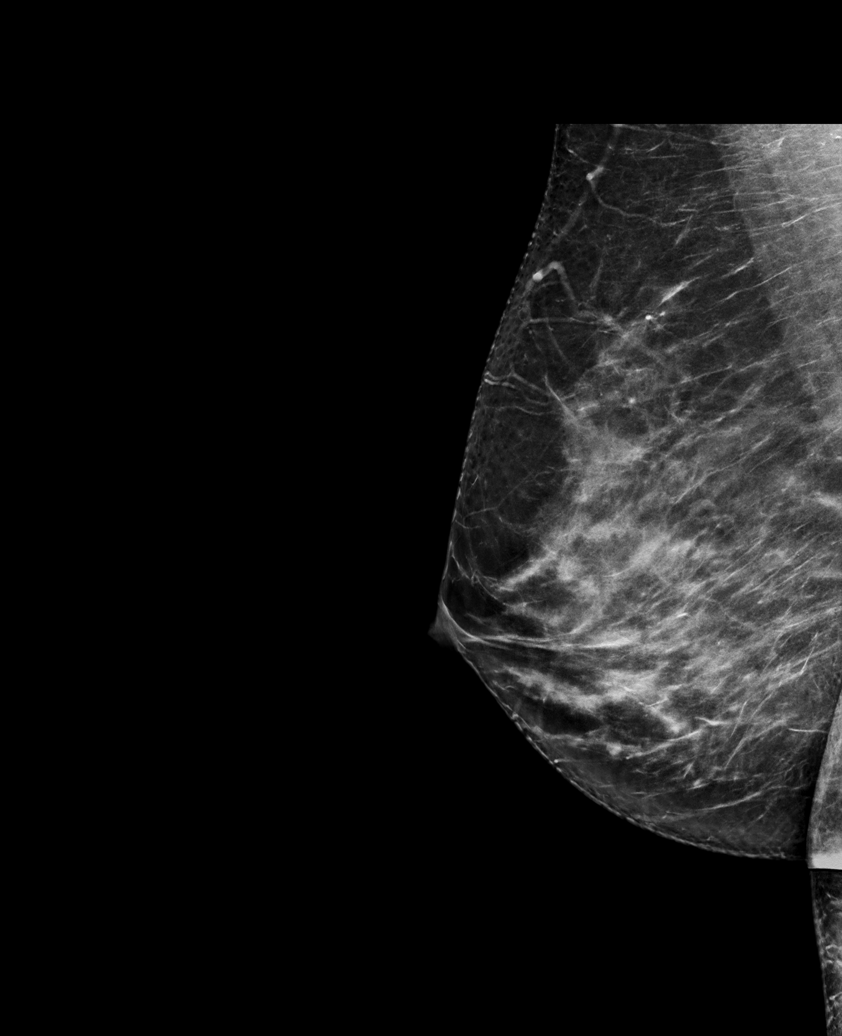

[R CC synth-2D]
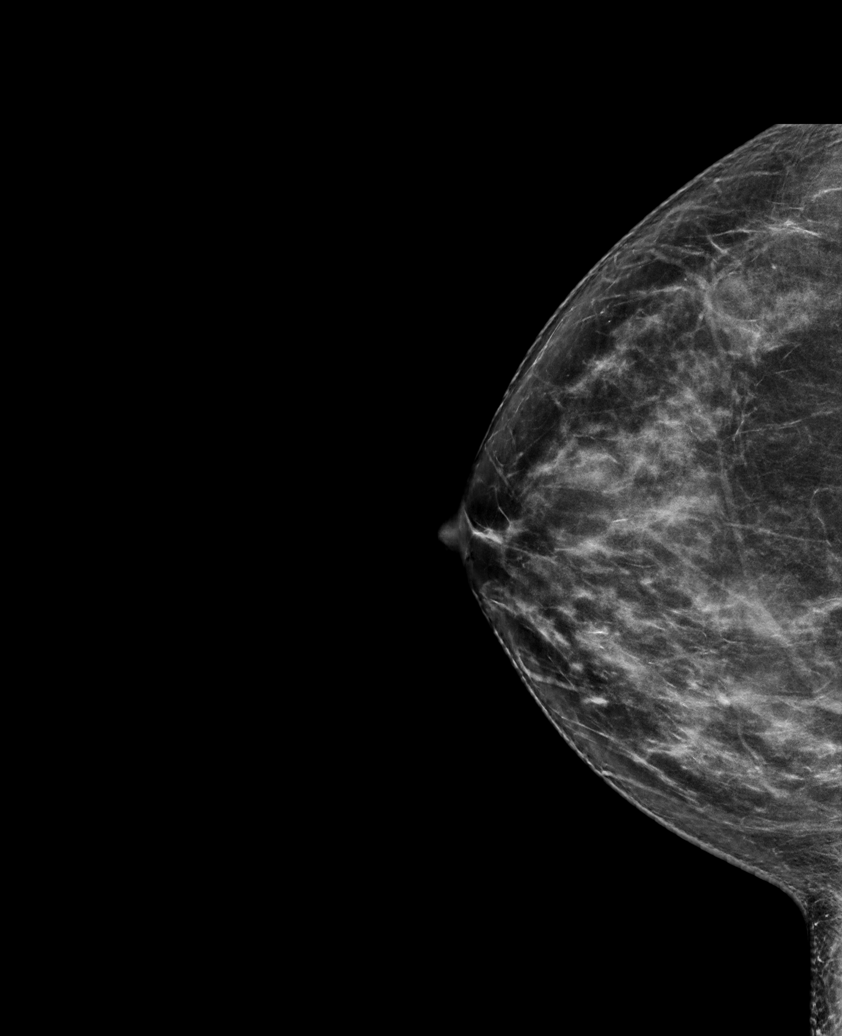

[L CC synth-2D]
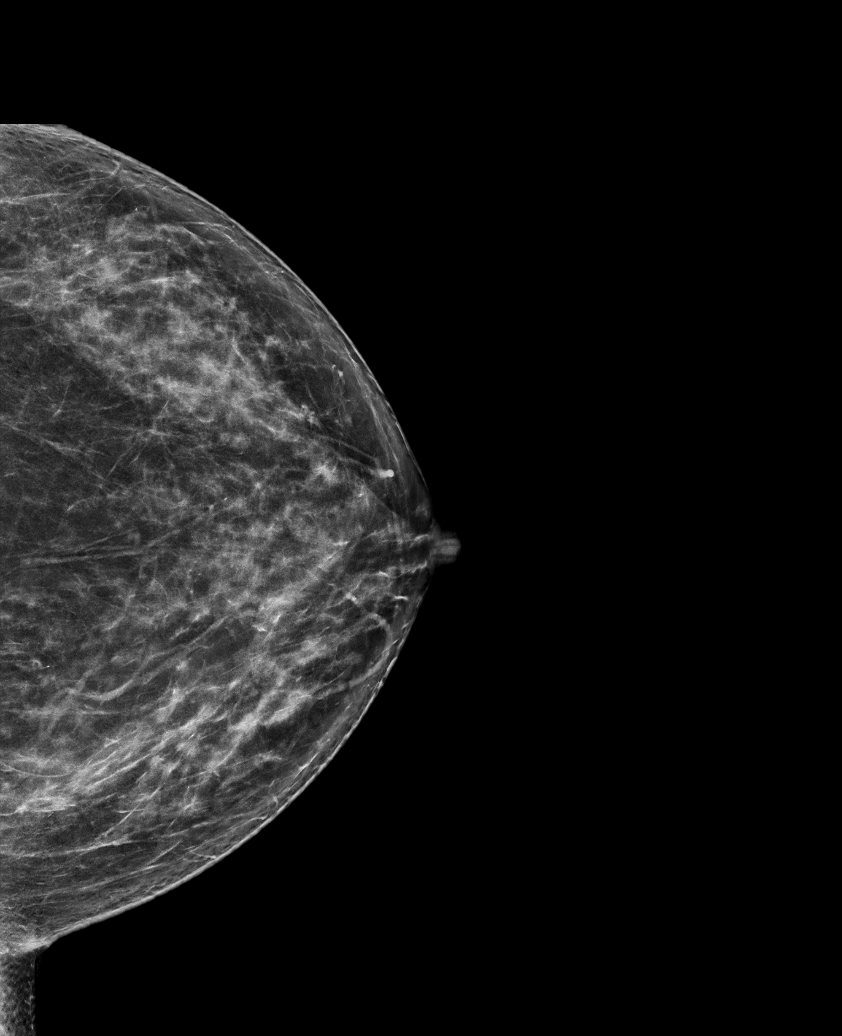

[L MLO synth-2D]
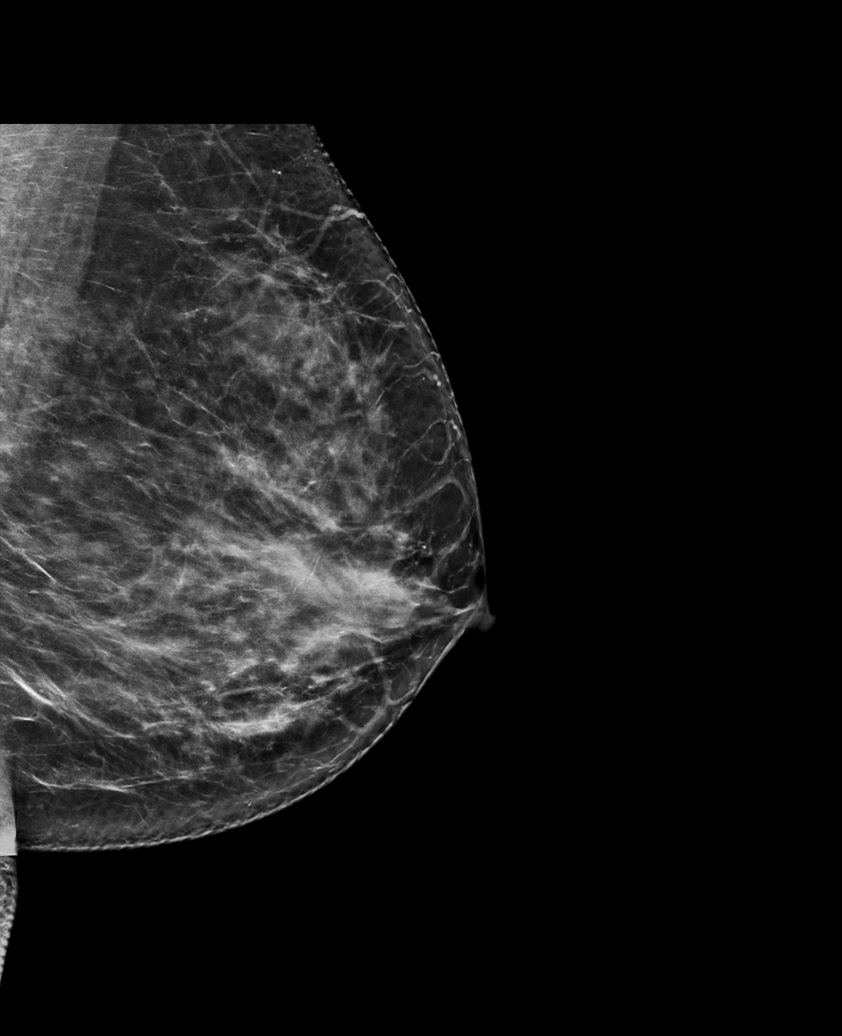

[R MLO tomo · tomo slice 41/82.0]
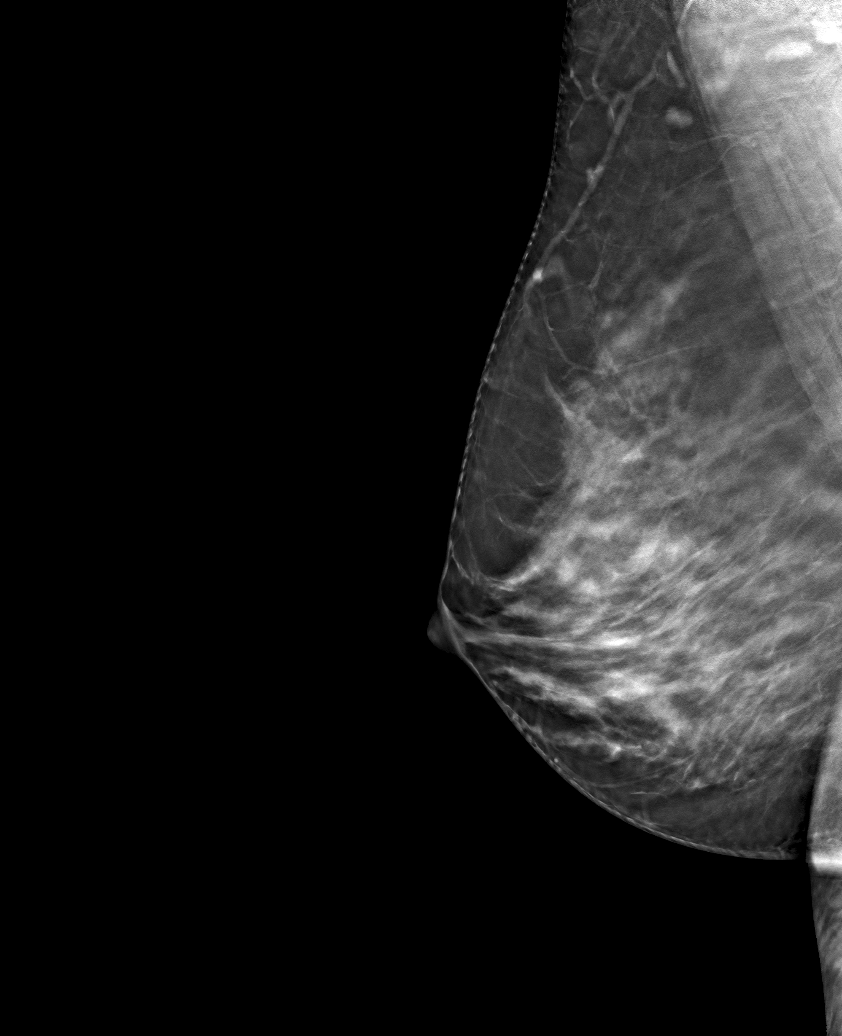

[5 of 24 positions shown; findings below may reference images not displayed]

ACR Breast Density Category c: The breast tissue is heterogeneously
dense, which may obscure small masses.
FINDINGS: Right breast: No suspicious mass, distortion, or microcalcifications
are identified to suggest presence of malignancy.

Left breast: No suspicious mass, distortion, or microcalcifications
are identified to suggest presence of malignancy.

Mammographic images were processed with CAD.

On physical exam there is asymmetry in the size of the breasts, left
greater than right, which patient reports has been present since she
was breast feeding, which she stopped 1 year ago. She also reports
intermittent diffuse left breast pain also going on for
approximately 1 year. The patient reports that she breast fed more
on the left than on the right. No nipple discharge. I palpate no
fixed discrete mass in the left breast.
IMPRESSION: No mammographic evidence of malignancy in the bilateral breasts.
Asymmetry in the left breast size and diffuse left breast pain has
been going on for approximately 1 year, possibly the sequela of
breast feeding.

RECOMMENDATION:
1. Clinical follow-up as needed for the left breast pain. Encourage
patient to try warm compresses and over-the-counter pain relievers.
If pain persists or worsens, or patient feels a new lump in the left
breast, she should return for further evaluation.

2.  Screening mammogram in one year.(Code:EL-P-VL5)

I have discussed the findings and recommendations with the patient.
If applicable, a reminder letter will be sent to the patient
regarding the next appointment.

BI-RADS CATEGORY  2: Benign.
ACR Breast Density Category c: The breast tissue is heterogeneously
dense, which may obscure small masses.
FINDINGS: Right breast: No suspicious mass, distortion, or microcalcifications
are identified to suggest presence of malignancy.

Left breast: No suspicious mass, distortion, or microcalcifications
are identified to suggest presence of malignancy.

Mammographic images were processed with CAD.

On physical exam there is asymmetry in the size of the breasts, left
greater than right, which patient reports has been present since she
was breast feeding, which she stopped 1 year ago. She also reports
intermittent diffuse left breast pain also going on for
approximately 1 year. The patient reports that she breast fed more
on the left than on the right. No nipple discharge. I palpate no
fixed discrete mass in the left breast.
IMPRESSION: No mammographic evidence of malignancy in the bilateral breasts.
Asymmetry in the left breast size and diffuse left breast pain has
been going on for approximately 1 year, possibly the sequela of
breast feeding.

RECOMMENDATION:
1. Clinical follow-up as needed for the left breast pain. Encourage
patient to try warm compresses and over-the-counter pain relievers.
If pain persists or worsens, or patient feels a new lump in the left
breast, she should return for further evaluation.

2.  Screening mammogram in one year.(Code:EL-P-VL5)

I have discussed the findings and recommendations with the patient.
If applicable, a reminder letter will be sent to the patient
regarding the next appointment.

BI-RADS CATEGORY  2: Benign.

## 2023-09-18 ENCOUNTER — Other Ambulatory Visit: Payer: Self-pay | Admitting: Family Medicine

## 2023-09-18 DIAGNOSIS — R946 Abnormal results of thyroid function studies: Secondary | ICD-10-CM

## 2023-09-19 ENCOUNTER — Other Ambulatory Visit: Payer: Self-pay | Admitting: Family Medicine

## 2023-09-19 DIAGNOSIS — Z1231 Encounter for screening mammogram for malignant neoplasm of breast: Secondary | ICD-10-CM
